# Patient Record
Sex: Male | Born: 1965 | Race: Black or African American | Hispanic: No | Marital: Single | Smoking: Never smoker
Health system: Southern US, Community
[De-identification: ages and names within clinical notes are randomized; demographics above are authoritative.]

## PROBLEM LIST (undated history)

## (undated) DIAGNOSIS — K219 Gastro-esophageal reflux disease without esophagitis: Secondary | ICD-10-CM

---

## 2013-08-26 ENCOUNTER — Encounter (HOSPITAL_COMMUNITY): Payer: Self-pay | Admitting: Emergency Medicine

## 2013-08-26 ENCOUNTER — Emergency Department (HOSPITAL_COMMUNITY)
Admission: EM | Admit: 2013-08-26 | Discharge: 2013-08-26 | Disposition: A | Discharge status: Home / Self Care | Payer: Self-pay | Attending: Emergency Medicine | Admitting: Emergency Medicine

## 2013-08-26 ENCOUNTER — Emergency Department (HOSPITAL_COMMUNITY): Payer: Self-pay

## 2013-08-26 DIAGNOSIS — M25519 Pain in unspecified shoulder: Secondary | ICD-10-CM | POA: Insufficient documentation

## 2013-08-26 DIAGNOSIS — Z79899 Other long term (current) drug therapy: Secondary | ICD-10-CM | POA: Insufficient documentation

## 2013-08-26 DIAGNOSIS — R143 Flatulence: Secondary | ICD-10-CM

## 2013-08-26 DIAGNOSIS — R141 Gas pain: Secondary | ICD-10-CM | POA: Insufficient documentation

## 2013-08-26 DIAGNOSIS — R079 Chest pain, unspecified: Secondary | ICD-10-CM | POA: Insufficient documentation

## 2013-08-26 DIAGNOSIS — M542 Cervicalgia: Secondary | ICD-10-CM | POA: Insufficient documentation

## 2013-08-26 DIAGNOSIS — R12 Heartburn: Secondary | ICD-10-CM | POA: Insufficient documentation

## 2013-08-26 DIAGNOSIS — K3189 Other diseases of stomach and duodenum: Secondary | ICD-10-CM | POA: Insufficient documentation

## 2013-08-26 DIAGNOSIS — IMO0002 Reserved for concepts with insufficient information to code with codable children: Secondary | ICD-10-CM | POA: Insufficient documentation

## 2013-08-26 DIAGNOSIS — R1013 Epigastric pain: Secondary | ICD-10-CM | POA: Insufficient documentation

## 2013-08-26 DIAGNOSIS — R142 Eructation: Secondary | ICD-10-CM | POA: Insufficient documentation

## 2013-08-26 LAB — CBC WITH DIFFERENTIAL/PLATELET
BASOS PCT: 0 % (ref 0–1)
Basophils Absolute: 0 10*3/uL (ref 0.0–0.1)
EOS ABS: 0.1 10*3/uL (ref 0.0–0.7)
EOS PCT: 2 % (ref 0–5)
HCT: 39.9 % (ref 39.0–52.0)
Hemoglobin: 14.3 g/dL (ref 13.0–17.0)
Lymphocytes Relative: 37 % (ref 12–46)
Lymphs Abs: 2.4 10*3/uL (ref 0.7–4.0)
MCH: 30.8 pg (ref 26.0–34.0)
MCHC: 35.8 g/dL (ref 30.0–36.0)
MCV: 85.8 fL (ref 78.0–100.0)
MONOS PCT: 8 % (ref 3–12)
Monocytes Absolute: 0.5 10*3/uL (ref 0.1–1.0)
Neutro Abs: 3.3 10*3/uL (ref 1.7–7.7)
Neutrophils Relative %: 53 % (ref 43–77)
PLATELETS: 278 10*3/uL (ref 150–400)
RBC: 4.65 MIL/uL (ref 4.22–5.81)
RDW: 13.7 % (ref 11.5–15.5)
WBC: 6.3 10*3/uL (ref 4.0–10.5)

## 2013-08-26 LAB — COMPREHENSIVE METABOLIC PANEL
ALT: 30 U/L (ref 0–53)
AST: 27 U/L (ref 0–37)
Albumin: 4.1 g/dL (ref 3.5–5.2)
Alkaline Phosphatase: 46 U/L (ref 39–117)
BUN: 11 mg/dL (ref 6–23)
CALCIUM: 9.1 mg/dL (ref 8.4–10.5)
CO2: 20 mEq/L (ref 19–32)
CREATININE: 0.97 mg/dL (ref 0.50–1.35)
Chloride: 102 mEq/L (ref 96–112)
GFR calc non Af Amer: >90 mL/min (ref >90)
Glucose, Bld: 88 mg/dL (ref 70–99)
Potassium: 3.5 mEq/L — ABNORMAL LOW (ref 3.7–5.3)
Sodium: 139 mEq/L (ref 137–147)
TOTAL PROTEIN: 7.8 g/dL (ref 6.0–8.3)
Total Bilirubin: 0.4 mg/dL (ref 0.3–1.2)

## 2013-08-26 LAB — LIPASE, BLOOD: LIPASE: 24 U/L (ref 11–59)

## 2013-08-26 LAB — TROPONIN I: Troponin I: <0.3 ng/mL (ref <0.30)

## 2013-08-26 LAB — I-STAT TROPONIN, ED: TROPONIN I, POC: 0.01 ng/mL (ref 0.00–0.08)

## 2013-08-26 MED ORDER — PANTOPRAZOLE SODIUM 20 MG PO TBEC: 20.0000 mg | DELAYED_RELEASE_TABLET | Freq: Two times a day (BID) | ORAL | Status: AC

## 2013-08-26 NOTE — ED Provider Notes (Signed)
I saw and evaluated the patient, reviewed the resident's note and I agree with the findings and plan.   EKG Interpretation   Date/Time:  Saturday August 26 2013 17:57:36 EDT Ventricular Rate:  82 PR Interval:  138 QRS Duration: 86 QT Interval:  370 QTC Calculation: 432 R Axis:   69 Text Interpretation:  Normal sinus rhythm Normal ECG Confirmed by Javarius Tsosie,   DO, Danali Marinos ([id number]) on 08/26/2013 6:22:54 PM      Pt is a 48 y.o. M with no significant past medical history presents emergency department with epigastric pain has been present for the past several weeks her medically. It is worse with food. He has also had left lateral neck pain and shoulder pain is worse with movement of his shoulder. His abdominal pain does not radiate into his neck or shoulder. This is a shortness of breath, nausea, vomiting, diarrhea, melena or blurred blood per rectum. Pain is not exertional or pleuritic. He has no history of hypertension, diabetes, hyperlipidemia or tobacco use. He does have a family history of cardiac disease. He has had a negative stress test within the past 2 years. His heart score is 2. On exam, patient is clinically stable, heart lung sounds are normal. Abdomen soft and nontender. No calf swelling or pain. Suspect his symptoms are due to gastritis, GERD. Less likely ACS. His first set of enzymes are negative. EKG is normal. Chest x-ray shows no abnormality. We'll repeat a second set of cardiac enzymes. If the patient is safe to be discharged home for outpatient stress test and PCP followup.  Layla MawKristen N Allure Greaser, DO 08/26/13 2100

## 2013-08-26 NOTE — ED Provider Notes (Signed)
CSN: [id number]     Arrival date & time 08/26/13  1752 History   First MD Initiated Contact with Patient 08/26/13 1822     Chief Complaint  Patient presents with  . Chest Pain   Patient is a 49 y.o. male presenting with chest pain and abdominal pain.  Chest Pain Pain location:  Epigastric Pain quality: burning and hot   Pain radiates to:  Neck Pain radiates to the back: no   Pain severity:  Mild Onset quality:  Gradual Duration:  4 days Timing:  Intermittent Progression:  Waxing and waning Chronicity:  New Context: eating   Relieved by:  Antacids Exacerbated by: eating. Ineffective treatments:  None tried Associated symptoms: abdominal pain and heartburn   Associated symptoms: no anorexia, no back pain, no cough, no diaphoresis, no dysphagia, no fatigue, no fever, no nausea, no orthopnea, no shortness of breath, not vomiting and no weakness   Risk factors: no aortic disease, no coronary artery disease, no diabetes mellitus, no high cholesterol, no hypertension, no prior DVT/PE, no smoking and no surgery   Abdominal Pain Pain location:  Epigastric Pain quality: burning and fullness   Pain radiates to:  Chest Pain severity:  Mild Context: eating   Context: not alcohol use, not diet changes and not recent illness   Relieved by:  Antacids Associated symptoms: chest pain   Associated symptoms: no anorexia, no cough, no fatigue, no fever, no hematemesis, no hematochezia, no nausea, no shortness of breath, no sore throat and no vomiting   Risk factors: NSAID use   Risk factors: no alcohol abuse, no aspirin use, has not had multiple surgeries and no recent hospitalization    He is an active person, has been walking long distances without any increase in his discomfort. He has not had any cough or shortness of breath. No other respiratory symptoms. No leg swelling or calf pain.  His risk factors for coronary artery disease include only history of premature MI in his brother in his early  54s. He has no hypertension, hyperlipidemia, diabetes, smoking history, history of drug abuse, or other identifiable risk factors. He's never had a DVT or PE. He's not had any weakness, numbness, paresthesias, or other neurologic symptoms.   Of note, he does report some pain in his neck and his shoulder, but states that these are unrelated. He started happening before he started having epigastric discomfort. He was diagnosed with a muscle strain by his primary care provider. His shoulder and neck pain are worse with movement of his left arm at the shoulder. They're also worse with palpating his neck and shoulder.  History reviewed. No pertinent past medical history. History reviewed. No pertinent past surgical history. No family history on file. History  Substance Use Topics  . Smoking status: Never Smoker   . Smokeless tobacco: Not on file  . Alcohol Use: Yes    Review of Systems  Constitutional: Negative for fever, diaphoresis and fatigue.  HENT: Negative for sore throat and trouble swallowing.   Respiratory: Negative for cough and shortness of breath.   Cardiovascular: Positive for chest pain. Negative for orthopnea.  Gastrointestinal: Positive for heartburn and abdominal pain. Negative for nausea, vomiting, hematochezia, anorexia and hematemesis.  Musculoskeletal: Negative for back pain.  Neurological: Negative for weakness.      Allergies  Review of patient's allergies indicates no known allergies.  Home Medications   Current Outpatient Rx  Name  Route  Sig  Dispense  Refill  . fluticasone (FLONASE)  50 MCG/ACT nasal spray   Each Nare   Place 1 spray into both nostrils daily.         . Multiple Vitamins-Minerals (MULTIVITAMIN WITH MINERALS) tablet   Oral   Take 1 tablet by mouth daily.         . tamsulosin (FLOMAX) 0.4 MG CAPS capsule   Oral   Take 0.4 mg by mouth daily.         . pantoprazole (PROTONIX) 20 MG tablet   Oral   Take 1 tablet (20 mg total) by  mouth 2 (two) times daily.   90 tablet   0    BP 127/85  Pulse 62  Temp(Src) 97.8 F (36.6 C) (Oral)  Resp 18  Ht 5\' 8"  (1.727 m)  Wt 239 lb (108.41 kg)  BMI 36.35 kg/m2  SpO2 100% Physical Exam  Nursing note and vitals reviewed. Constitutional: He is oriented to person, place, and time. He appears well-developed and well-nourished. No distress.  HENT:  Head: Normocephalic and atraumatic.  Mouth/Throat: Oropharynx is clear and moist.  Eyes: Conjunctivae and EOM are normal. Pupils are equal, round, and reactive to light. No scleral icterus.  Neck: Normal range of motion. Neck supple. No JVD present.  Cardiovascular: Normal rate, regular rhythm, normal heart sounds and intact distal pulses.  Exam reveals no gallop and no friction rub.   No murmur heard. Pulmonary/Chest: Effort normal and breath sounds normal. No respiratory distress. He has no wheezes. He has no rales.  Abdominal: Soft. Bowel sounds are normal. He exhibits no distension. There is no tenderness. There is no rebound and no guarding.  Musculoskeletal: He exhibits no edema.  Mild reproducible pain with palpation over the lateral neck soft tissues posteriorly on the left side as well as in the trapezius region on the left side.  Neurological: He is alert and oriented to person, place, and time. No cranial nerve deficit. He exhibits normal muscle tone. Coordination normal.  Skin: Skin is warm and dry. He is not diaphoretic.    ED Course  Procedures (including critical care time) Labs Review Labs Reviewed  COMPREHENSIVE METABOLIC PANEL - Abnormal; Notable for the following:    Potassium 3.5 (*)    All other components within normal limits  TROPONIN I  CBC WITH DIFFERENTIAL  LIPASE, BLOOD  I-STAT TROPOININ, ED   Imaging Review Dg Chest 2 View  08/26/2013   CLINICAL DATA:  Chest pain  EXAM: CHEST  2 VIEW  COMPARISON:  None.  FINDINGS: Lungs are clear. Heart size and pulmonary vascularity are normal. No adenopathy.  No pneumothorax. No bone lesions.  IMPRESSION: No abnormality noted.   Electronically Signed   By: Bretta Bang M.D.   On: 08/26/2013 19:05     EKG Interpretation   Date/Time:  Saturday August 26 2013 17:57:36 EDT Ventricular Rate:  82 PR Interval:  138 QRS Duration: 86 QT Interval:  370 QTC Calculation: 432 R Axis:   69 Text Interpretation:  Normal sinus rhythm Normal ECG Confirmed by WARD,   DO, KRISTEN [id number] on 08/26/2013 6:22:54 PM      MDM   55 are old male presents complaining of chest/epigastric discomfort. It is burning, radiates up into his chest and his throat. Associated with heartburn. Associated with fullness and bloating. Similar pain is had in the past. He's had an EGD that demonstrated gastritis in the past. He has no associated shortness of breath, nausea, vomiting, diaphoresis. His pain is nonexertional. He has no respiratory symptoms.  No risk factors for or prior history of DVT or PE. No neurologic symptoms.  He has normal vitals. He is well appearing and in no acute distress. He has some reproducible discomfort with palpation over the lateral, posterior neck soft tissue and over the trapezius region on the left side. He has a normal cardiopulmonary exam. 2+ bilateral radial pulses. Lungs are clear to auscultation. No peripheral edema.  EKG is completely normal with no ischemic changes. Chest x-ray demonstrates normal heart size, no pneumothorax, no acute cardiopulmonary abnormality.  His discomfort is most consistent with a GI etiology. He is an NSAID user. He has been diagnosed with gastritis performed by EGD remotely. He does not have any known history of ulcer disease or H. pylori. His discomfort is atypical for cardiac etiology. It is not exertional. It is not associated with nausea vomiting shortness of breath diaphoresis. He is low risk for cardiac disease, HEART score is 2 for his age if there is fracture of premature MI in his brother. Given his low heart  score, normal EKG, negative troponin x2, I feel that he is risk stratified to very low risk for major adverse cardiac events in the near term. If it is appropriate for discharge with outpatient followup with his primary care physician to discuss further. We'll start him on protonix.  He does tell his wife was diagnosed with H. pylori recently. I feel he needs to be tested in May need triple therapy. [facility] [place]. Advised him to followup within the next one week for reevaluation.  He has a PE, his pertinent negatives, not suspicious for aortic dissection or other emergent cause given his history and reassuring exam.  Final diagnoses:  Chest pain  Epigastric pain  Dyspepsia       Toney SangJerrid Marcelia Petersen, MD 08/27/13 [phone]

## 2013-08-26 NOTE — Discharge Instructions (Signed)
Chest Pain (Nonspecific) °It is often hard to give a specific diagnosis for the cause of chest pain. There is always a chance that your pain could be related to something serious, such as a heart attack or a blood clot in the lungs. You need to follow up with your caregiver for further evaluation. °CAUSES  °· Heartburn. °· Pneumonia or bronchitis. °· Anxiety or stress. °· Inflammation around your heart (pericarditis) or lung (pleuritis or pleurisy). °· A blood clot in the lung. °· A collapsed lung (pneumothorax). It can develop suddenly on its own (spontaneous pneumothorax) or from injury (trauma) to the chest. °· Shingles infection (herpes zoster virus). °The chest wall is composed of bones, muscles, and cartilage. Any of these can be the source of the pain. °· The bones can be bruised by injury. °· The muscles or cartilage can be strained by coughing or overwork. °· The cartilage can be affected by inflammation and become sore (costochondritis). °DIAGNOSIS  °Lab tests or other studies, such as X-rays, electrocardiography, stress testing, or cardiac imaging, may be needed to find the cause of your pain.  °TREATMENT  °· Treatment depends on what may be causing your chest pain. Treatment may include: °· Acid blockers for heartburn. °· Anti-inflammatory medicine. °· Pain medicine for inflammatory conditions. °· Antibiotics if an infection is present. °· You may be advised to change lifestyle habits. This includes stopping smoking and avoiding alcohol, caffeine, and chocolate. °· You may be advised to keep your head raised (elevated) when sleeping. This reduces the chance of acid going backward from your stomach into your esophagus. °· Most of the time, nonspecific chest pain will improve within 2 to 3 days with rest and mild pain medicine. °HOME CARE INSTRUCTIONS  °· If antibiotics were prescribed, take your antibiotics as directed. Finish them even if you start to feel better. °· For the next few days, avoid physical  activities that bring on chest pain. Continue physical activities as directed. °· Do not smoke. °· Avoid drinking alcohol. °· Only take over-the-counter or prescription medicine for pain, discomfort, or fever as directed by your caregiver. °· Follow your caregiver's suggestions for further testing if your chest pain does not go away. °· Keep any follow-up appointments you made. If you do not go to an appointment, you could develop lasting (chronic) problems with pain. If there is any problem keeping an appointment, you must call to reschedule. °SEEK MEDICAL CARE IF:  °· You think you are having problems from the medicine you are taking. Read your medicine instructions carefully. °· Your chest pain does not go away, even after treatment. °· You develop a rash with blisters on your chest. °SEEK IMMEDIATE MEDICAL CARE IF:  °· You have increased chest pain or pain that spreads to your arm, neck, jaw, back, or abdomen. °· You develop shortness of breath, an increasing cough, or you are coughing up blood. °· You have severe back or abdominal pain, feel nauseous, or vomit. °· You develop severe weakness, fainting, or chills. °· You have a fever. °THIS IS AN EMERGENCY. Do not wait to see if the pain will go away. Get medical help at once. Call your local emergency services (911 in [place] Do not drive yourself to the hospital. °MAKE SURE YOU:  °· Understand these instructions. °· Will watch your condition. °· Will get help right away if you are not doing well or get worse. °Document Released: 02/18/2005 Document Revised: 08/03/2011 Document Reviewed: 12/15/2007 °[employer]® Patient Information ©2014 [employer]   [employer]. ° °

## 2013-08-26 NOTE — ED Notes (Signed)
Pt returned from xray

## 2013-08-26 NOTE — ED Notes (Signed)
The pt has had some mid-chest pain with radiation up into his lt neck and his lt shoulder for 3-4 days.  He was seen by a doctor yesterday and was diagnosed with a muscle pull.  The pain has continued with the pain

## 2021-04-08 ENCOUNTER — Emergency Department (HOSPITAL_BASED_OUTPATIENT_CLINIC_OR_DEPARTMENT_OTHER): Payer: BC Managed Care – PPO

## 2021-04-08 ENCOUNTER — Other Ambulatory Visit: Payer: Self-pay

## 2021-04-08 ENCOUNTER — Emergency Department (HOSPITAL_BASED_OUTPATIENT_CLINIC_OR_DEPARTMENT_OTHER)
Admission: EM | Admit: 2021-04-08 | Discharge: 2021-04-08 | Disposition: A | Discharge status: Home / Self Care | Payer: BC Managed Care – PPO | Attending: Emergency Medicine | Admitting: Emergency Medicine

## 2021-04-08 ENCOUNTER — Encounter (HOSPITAL_BASED_OUTPATIENT_CLINIC_OR_DEPARTMENT_OTHER): Payer: Self-pay

## 2021-04-08 DIAGNOSIS — M79604 Pain in right leg: Secondary | ICD-10-CM

## 2021-04-08 DIAGNOSIS — M79661 Pain in right lower leg: Secondary | ICD-10-CM | POA: Diagnosis present

## 2021-04-08 HISTORY — DX: Gastro-esophageal reflux disease without esophagitis: K21.9

## 2021-04-08 NOTE — ED Triage Notes (Signed)
Pt presents to ED from home C/O R leg pain X 2 weeks. Denies injury.

## 2021-04-08 NOTE — ED Provider Notes (Signed)
[facility] Provider Note   CSN: [id number] Arrival date & time: 04/08/21  1436     History Chief Complaint  Patient presents with   Leg Pain    Juan Bernard is a 55 y.o. male.  Patient is a 56 year old male who presents with pain in his right leg.  He says been going on for about 2 weeks.  It starts behind his knee and goes down his leg.  At times if he is laying down, he feels like he has some mild numbness in the leg.  No weakness.  No associated back pain.  No pain above the knee.  No chest pain or shortness of breath.  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  He denies any known injury to the leg.      Past Medical History:  Diagnosis Date   GERD (gastroesophageal reflux disease)     There are no problems to display for this patient.   History reviewed. No pertinent surgical history.     No family history on file.  Social History   Tobacco Use   Smoking status: Never   Smokeless tobacco: Never  Substance Use Topics   Alcohol use: Yes    Comment: Occasionally   Drug use: Never    Home Medications Prior to Admission medications   Medication Sig Start Date End Date Taking? Authorizing Provider  fluticasone (FLONASE) 50 MCG/ACT nasal spray Place 1 spray into both nostrils daily.    [provider]  Multiple Vitamins-Minerals (MULTIVITAMIN WITH MINERALS) tablet Take 1 tablet by mouth daily.    [provider]  pantoprazole (PROTONIX) 20 MG tablet Take 1 tablet (20 mg total) by mouth 2 (two) times daily. 08/26/13   Wendall Papa, MD  tamsulosin (FLOMAX) 0.4 MG CAPS capsule Take 0.4 mg by mouth daily.    [provider]  RABEprazole (ACIPHEX) 20 MG tablet Take 20 mg by mouth daily as needed (acid reflux).  08/26/13  [provider]    Allergies    Patient has no known allergies.  Review of Systems   Review of Systems  Constitutional:  Negative for chills,  diaphoresis, fatigue and fever.  HENT:  Negative for congestion, rhinorrhea and sneezing.   Eyes: Negative.   Respiratory:  Negative for cough, chest tightness and shortness of breath.   Cardiovascular:  Negative for chest pain and leg swelling.  Gastrointestinal:  Negative for abdominal pain, blood in stool, diarrhea, nausea and vomiting.  Genitourinary:  Negative for difficulty urinating, flank pain, frequency and hematuria.  Musculoskeletal:  Positive for myalgias. Negative for arthralgias, back pain and joint swelling.  Skin:  Negative for rash.  Neurological:  Negative for dizziness, speech difficulty, weakness, numbness and headaches.   Physical Exam Updated Vital Signs BP (!) 130/99 (BP Location: Right Arm)   Pulse 72   Temp 98.1 F (36.7 C) (Oral)   Resp 18   Ht 5\' 8"  (1.727 m)   Wt 109.8 kg   SpO2 98%   BMI 36.80 kg/m   Physical Exam Constitutional:      Appearance: He is well-developed.  HENT:     Head: Normocephalic and atraumatic.  Eyes:     Pupils: Pupils are equal, round, and reactive to light.  Cardiovascular:     Rate and Rhythm: Normal rate and regular rhythm.     Heart sounds: Normal heart sounds.  Pulmonary:     Effort:  Pulmonary effort is normal. No respiratory distress.     Breath sounds: Normal breath sounds. No wheezing or rales.  Chest:     Chest wall: No tenderness.  Abdominal:     General: Bowel sounds are normal.     Palpations: Abdomen is soft.     Tenderness: There is no abdominal tenderness. There is no guarding or rebound.  Musculoskeletal:        General: Normal range of motion.     Cervical back: Normal range of motion and neck supple.     Comments: There is some tenderness in the posterior aspect of the right knee.  There is mild pain in the posterior calf.  No noticeable swelling is noted to the leg.  He has normal sensation and motor function distally.  Pedal pulses are intact by Doppler.  No discoloration.  Lymphadenopathy:      Cervical: No cervical adenopathy.  Skin:    General: Skin is warm and dry.     Findings: No rash.  Neurological:     Mental Status: He is alert and oriented to person, place, and time.    ED Results / Procedures / Treatments   Labs (all labs ordered are listed, but only abnormal results are displayed) Labs Reviewed - No data to display  EKG None  Radiology US Venous Img Lower Right (DVT Study)  Result Date: 04/08/2021 CLINICAL DATA:  leg pain EXAM: RIGHT LOWER EXTREMITY VENOUS DOPPLER ULTRASOUND TECHNIQUE: Gray-scale sonography with compression, as well as color and duplex ultrasound, were performed to evaluate the deep venous system(s) from the level of the common femoral vein through the popliteal and proximal calf veins. COMPARISON:  None. FINDINGS: VENOUS Normal compressibility of the common femoral, superficial femoral, and popliteal veins, as well as the visualized calf veins. Visualized portions of profunda femoral vein and great saphenous vein unremarkable. No filling defects to suggest DVT on grayscale or color Doppler imaging. Doppler waveforms show normal direction of venous flow, normal respiratory plasticity and response to augmentation. Limited views of the contralateral common femoral vein are unremarkable. IMPRESSION: No evidence of DVT in the right lower extremity. Electronically Signed   By: Margaretha Sheffield M.D.   On: 04/08/2021 15:59    Procedures Procedures   Medications Ordered in ED Medications - No data to display  ED Course  I have reviewed the triage vital signs and the nursing notes.  Pertinent labs & imaging results that were available during my care of the patient were reviewed by me and considered in my medical decision making (see chart for details).    MDM Rules/Calculators/A&P                           Patient is a 55 year old male who presents with some intermittent discomfort in his right lower leg.  Ultrasound shows no evidence of a DVT.   There is no color change or signs of infection.  He has distal pulses intact.  He does not have any associated back pain.  He is neurologically intact.  This could represent some radicular back pain versus muscular pain.  He was discharged home in good condition.  He will follow-up with his PCP if his symptoms continue.  Return precautions were given. Final Clinical Impression(s) / ED Diagnoses Final diagnoses:  Right leg pain    Rx / DC Orders ED Discharge Orders     None        Malvin Johns,  MD 04/08/21 1610

## 2022-02-15 IMAGING — US US EXTREM LOW VENOUS*R*
1 series · 14 of 24 positions shown · non-contrast
Comparison: None.

CLINICAL DATA: leg pain

EXAM:
RIGHT LOWER EXTREMITY VENOUS DOPPLER ULTRASOUND
TECHNIQUE: Gray-scale sonography with compression, as well as color and duplex
ultrasound, were performed to evaluate the deep venous system(s)
from the level of the common femoral vein through the popliteal and
proximal calf veins.

[Series 1: us extrem low venous*right* · 14 of 32 slices shown]
[im 1/32]
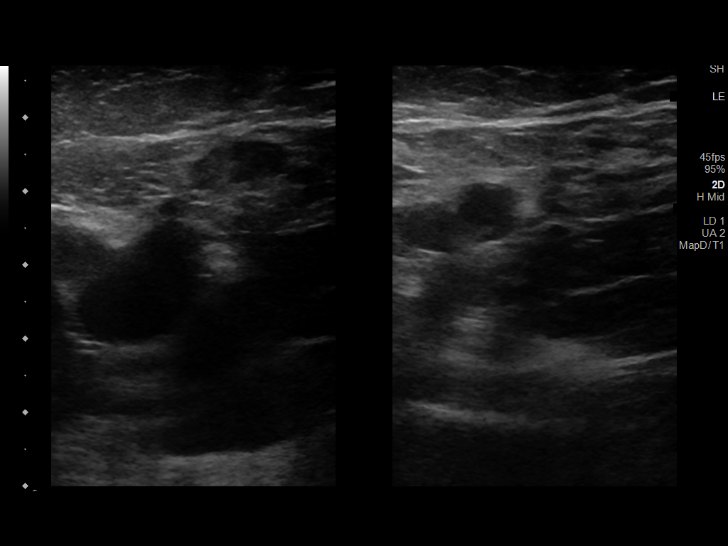
[im 3/32]
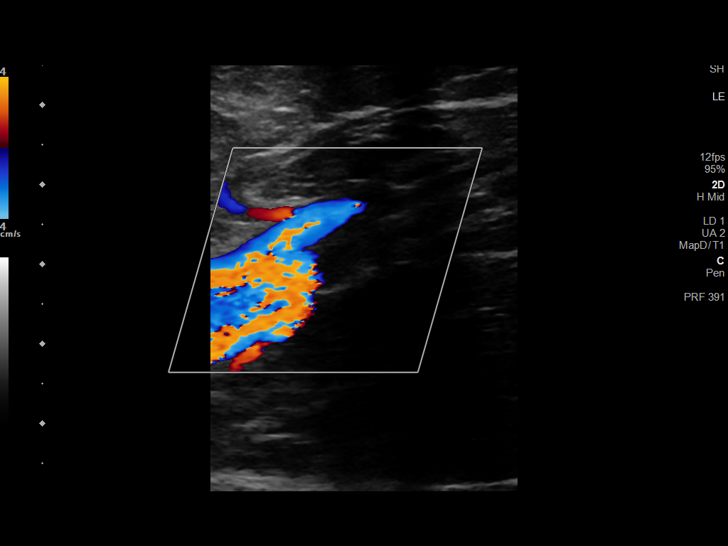
[im 6/32]
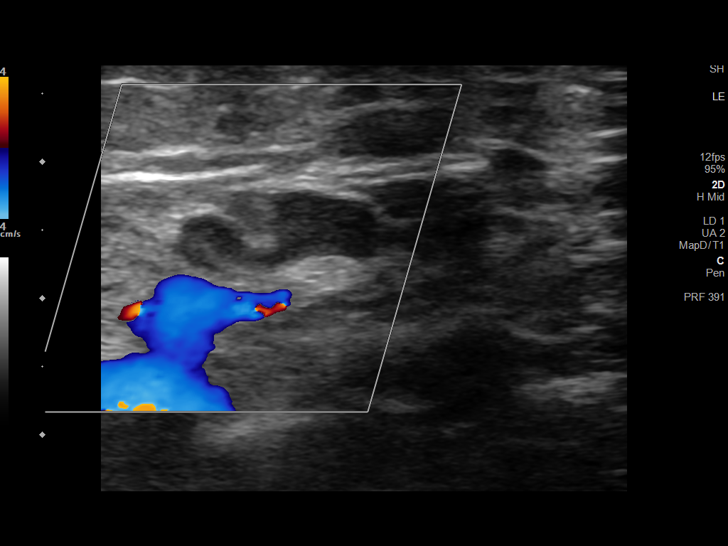
[im 9/32]
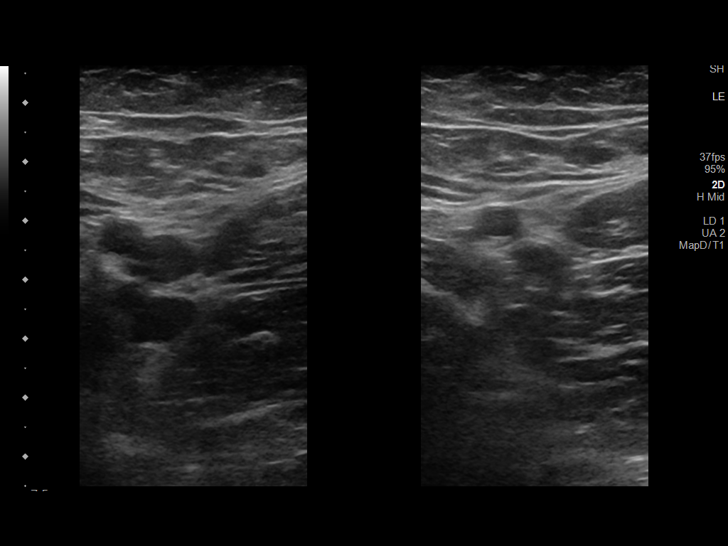
[im 10/32]
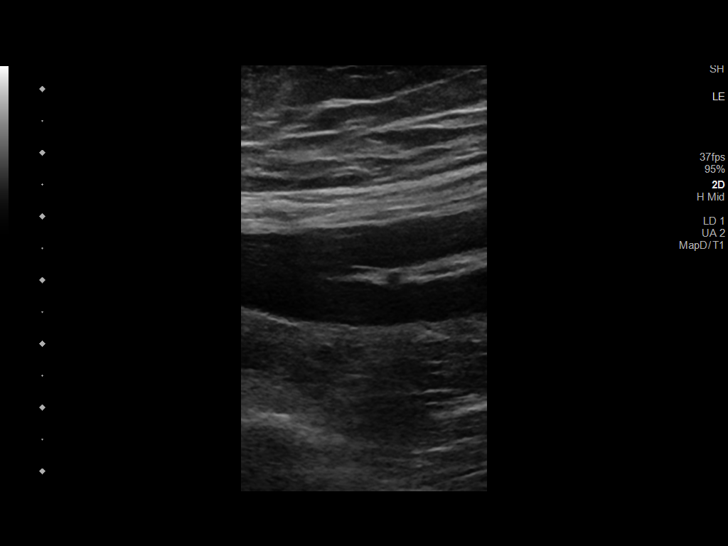
[im 13/32]
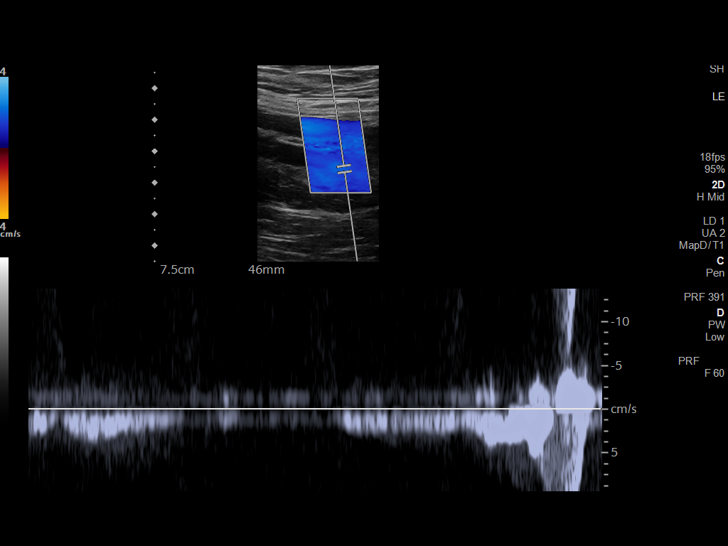
[im 15/32]
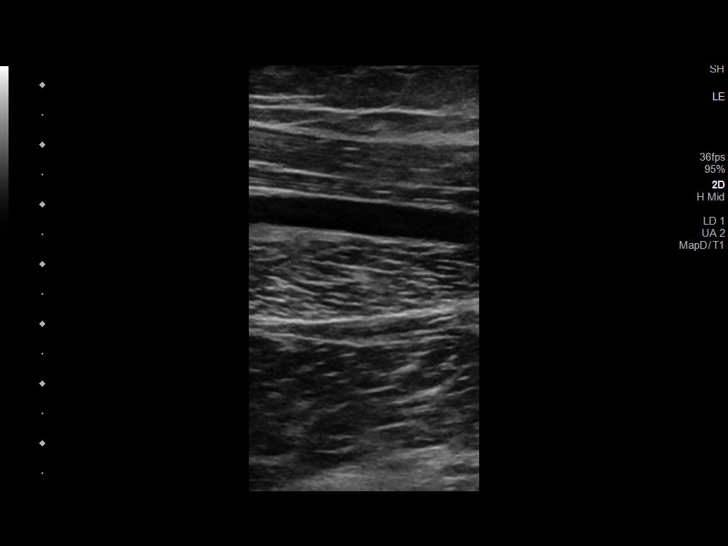
[im 17/32]
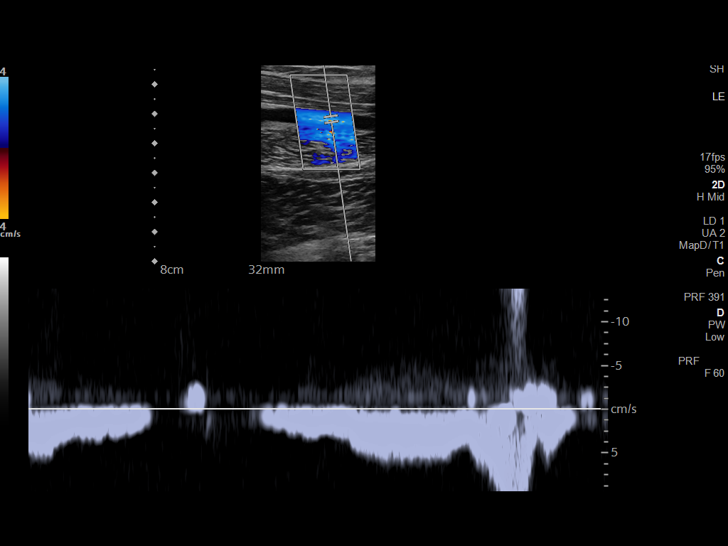
[im 19/32]
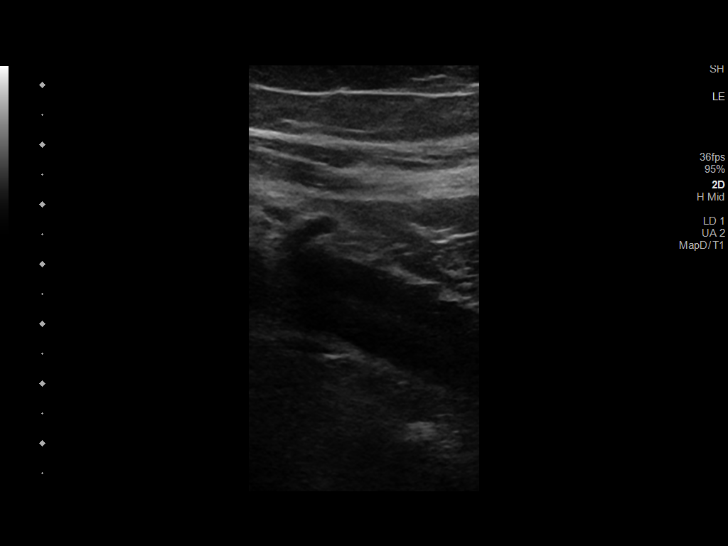
[im 22/32]
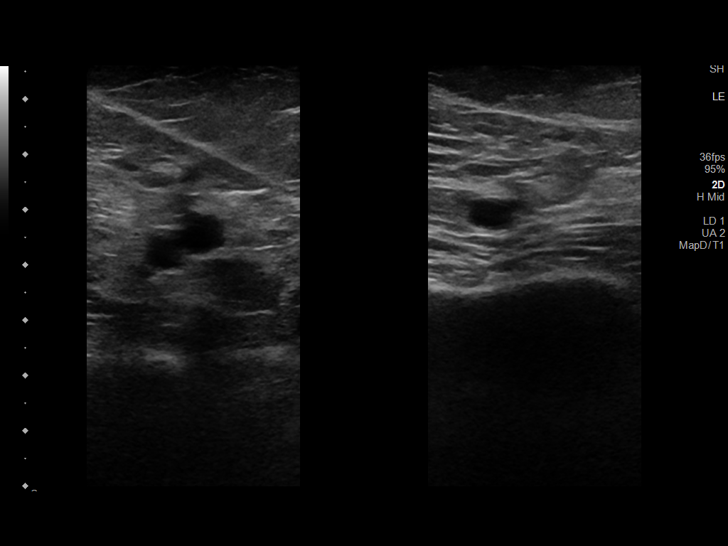
[im 25/32]
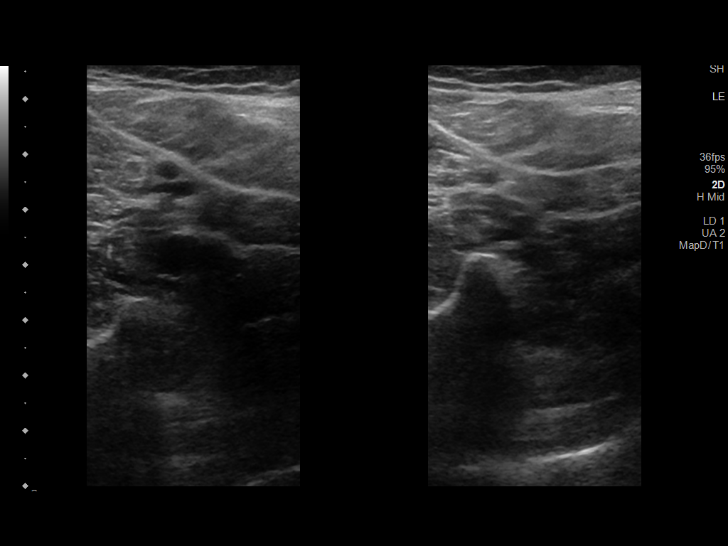
[im 26/32]
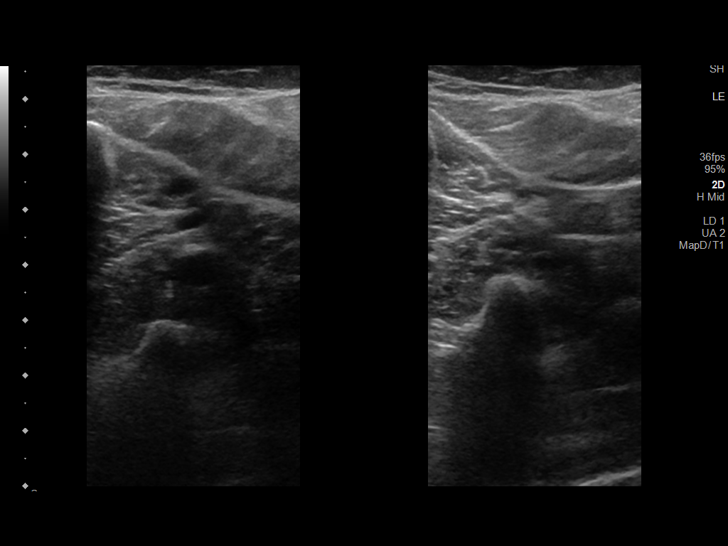
[im 29/32]
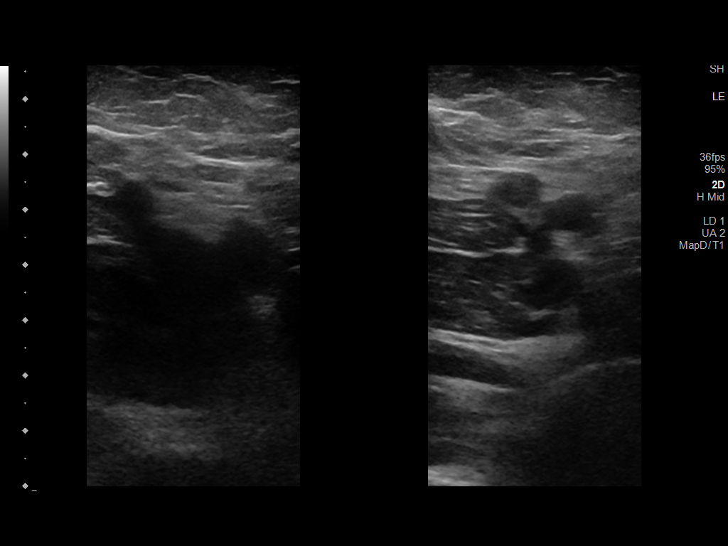
[im 32/32]
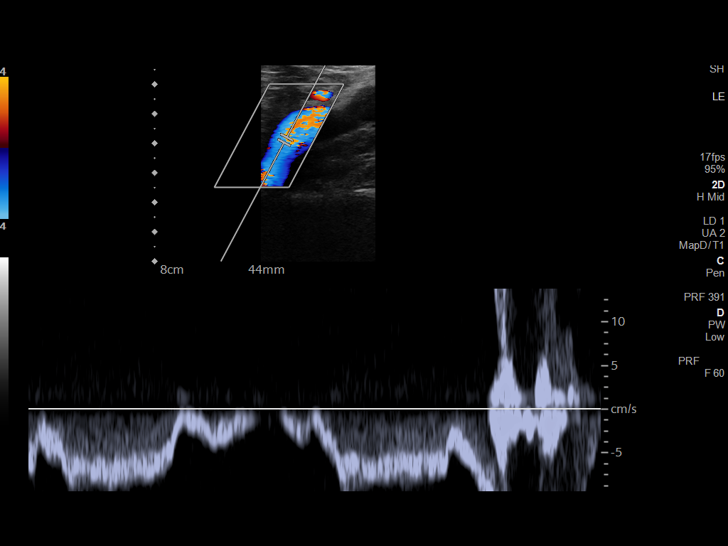

[14 of 24 positions shown; findings below may reference images not displayed]

FINDINGS: VENOUS

Normal compressibility of the common femoral, superficial femoral,
and popliteal veins, as well as the visualized calf veins.
Visualized portions of profunda femoral vein and great saphenous
vein unremarkable. No filling defects to suggest DVT on grayscale or
color Doppler imaging. Doppler waveforms show normal direction of
venous flow, normal respiratory plasticity and response to
augmentation.

Limited views of the contralateral common femoral vein are
unremarkable.
IMPRESSION: No evidence of DVT in the right lower extremity.

## 2023-12-10 ENCOUNTER — Encounter: Payer: Self-pay | Admitting: Advanced Practice Midwife
